# Patient Record
Sex: Male | Born: 1965 | Race: White | Hispanic: No | Marital: Married | Smoking: Never smoker
Health system: Southern US, Community
[De-identification: ages and names within clinical notes are randomized; demographics above are authoritative.]

## PROBLEM LIST (undated history)

## (undated) DIAGNOSIS — M1990 Unspecified osteoarthritis, unspecified site: Secondary | ICD-10-CM

## (undated) DIAGNOSIS — I10 Essential (primary) hypertension: Secondary | ICD-10-CM

## (undated) HISTORY — PX: JOINT REPLACEMENT: SHX530

## (undated) HISTORY — PX: SINOSCOPY: SHX187

## (undated) HISTORY — DX: Essential (primary) hypertension: I10

## (undated) HISTORY — DX: Unspecified osteoarthritis, unspecified site: M19.90

---

## 2002-03-09 ENCOUNTER — Emergency Department (HOSPITAL_COMMUNITY): Admission: EM | Admit: 2002-03-09 | Discharge: 2002-03-09 | Payer: Self-pay | Admitting: Emergency Medicine

## 2006-06-28 ENCOUNTER — Encounter: Admission: RE | Admit: 2006-06-28 | Discharge: 2006-06-28 | Payer: Self-pay | Admitting: Specialist

## 2009-03-01 ENCOUNTER — Encounter: Admission: RE | Admit: 2009-03-01 | Discharge: 2009-03-01 | Payer: Self-pay | Admitting: Family Medicine

## 2011-09-26 ENCOUNTER — Other Ambulatory Visit (HOSPITAL_COMMUNITY): Payer: Self-pay | Admitting: Anesthesiology

## 2011-09-26 DIAGNOSIS — M545 Low back pain: Secondary | ICD-10-CM

## 2011-09-30 ENCOUNTER — Ambulatory Visit (HOSPITAL_COMMUNITY)
Admission: RE | Admit: 2011-09-30 | Discharge: 2011-09-30 | Disposition: A | Discharge status: Home / Self Care | Payer: BC Managed Care – PPO | Source: Ambulatory Visit | Attending: Anesthesiology | Admitting: Anesthesiology

## 2011-09-30 DIAGNOSIS — M48061 Spinal stenosis, lumbar region without neurogenic claudication: Secondary | ICD-10-CM | POA: Insufficient documentation

## 2011-09-30 DIAGNOSIS — M545 Low back pain, unspecified: Secondary | ICD-10-CM | POA: Insufficient documentation

## 2011-09-30 DIAGNOSIS — Q762 Congenital spondylolisthesis: Secondary | ICD-10-CM | POA: Insufficient documentation

## 2011-09-30 DIAGNOSIS — IMO0001 Reserved for inherently not codable concepts without codable children: Secondary | ICD-10-CM | POA: Insufficient documentation

## 2013-04-20 ENCOUNTER — Ambulatory Visit (INDEPENDENT_AMBULATORY_CARE_PROVIDER_SITE_OTHER): Payer: BC Managed Care – PPO | Admitting: Family Medicine

## 2013-04-20 VITALS — BP 120/78 | HR 85 | Temp 97.9°F | Resp 18 | Ht 67.5 in | Wt 168.0 lb

## 2013-04-20 DIAGNOSIS — M722 Plantar fascial fibromatosis: Secondary | ICD-10-CM

## 2013-04-20 DIAGNOSIS — M79609 Pain in unspecified limb: Secondary | ICD-10-CM

## 2013-04-20 MED ORDER — METHYLPREDNISOLONE ACETATE 80 MG/ML IJ SUSP: 80.0000 mg | Freq: Once | INTRAMUSCULAR | Status: AC

## 2013-04-20 MED ORDER — PREDNISONE 20 MG PO TABS: ORAL_TABLET | ORAL | Status: DC

## 2013-04-20 NOTE — Progress Notes (Signed)
47 yo with left heel x 2 months, worsening. No injury recalled.  Tried inserts, Naproxen  Objective:  NAD Tender left heel at site of origin of plantar fascia Left heel was injected after sterile prep with Depo-Medrol 80 mg and 1 cc of Marcaine. He is moderate relief from pain in his gait was stable when he left.  Assessment:  Plantar fasciitis, chronic  Plantar fasciitis of left foot - Plan: methylPREDNISolone acetate (DEPO-MEDROL) injection 80 mg, predniSONE (DELTASONE) 20 MG tablet  Signed, Elvina Sidle, MD

## 2013-04-20 NOTE — Patient Instructions (Signed)

## 2013-10-07 ENCOUNTER — Ambulatory Visit (INDEPENDENT_AMBULATORY_CARE_PROVIDER_SITE_OTHER): Payer: BC Managed Care – PPO | Admitting: Emergency Medicine

## 2013-10-07 VITALS — BP 110/70 | HR 82 | Temp 98.0°F | Resp 16 | Ht 66.5 in | Wt 166.0 lb

## 2013-10-07 DIAGNOSIS — M722 Plantar fascial fibromatosis: Secondary | ICD-10-CM

## 2013-10-07 MED ORDER — NAPROXEN SODIUM 550 MG PO TABS: 550.0000 mg | ORAL_TABLET | Freq: Two times a day (BID) | ORAL | Status: AC

## 2013-10-07 MED ORDER — TRIAMCINOLONE ACETONIDE 40 MG/ML IJ SUSP: 40.0000 mg | Freq: Once | INTRAMUSCULAR | Status: AC

## 2013-10-07 NOTE — Progress Notes (Signed)
[facility] [address] [place] [address] [phone]  Date:  10/07/2013   Name:  Bradley Shea   DOB:  [birth year]   MRN:  [id number]  PCP:  Delorse Lek, MD    Chief Complaint: Foot Pain   History of Present Illness:  Bradley Shea is a 47 y.o. very pleasant male patient who presents with the following:  History of plantar fasciitis.  Injected once previously.  xrayed and found to have a spur.  No history of injury.  Pain worse on arising and with walking.  No improvement with over the counter medications or other home remedies. Denies other complaint or health concern today.   There are no active problems to display for this patient.   Past Medical History  Diagnosis Date  . Arthritis   . Hypertension     Past Surgical History  Procedure Laterality Date  . Joint replacement      History  Substance Use Topics  . Smoking status: Never Smoker   . Smokeless tobacco: Not on file  . Alcohol Use: No    History reviewed. No pertinent family history.  Allergies  Allergen Reactions  . Codeine Anaphylaxis    Medication list has been reviewed and updated.  Current Outpatient Prescriptions on File Prior to Visit  Medication Sig Dispense Refill  . lisinopril (PRINIVIL,ZESTRIL) 40 MG tablet Take 40 mg by mouth daily.      . Naproxen Sodium (ALEVE PO) Take by mouth.       Current Facility-Administered Medications on File Prior to Visit  Medication Dose Route Frequency Provider Last Rate Last Dose  . methylPREDNISolone acetate (DEPO-MEDROL) injection 80 mg  80 mg Intramuscular Once Elvina Sidle, MD        Review of Systems:  As per HPI, otherwise negative.    Physical Examination: Filed Vitals:   10/07/13 1641  BP: 110/70  Pulse: 82  Temp: 98 F (36.7 C)  Resp: 16   Filed Vitals:   10/07/13 1641  Height: 5' 6.5" (1.689 m)  Weight: 166 lb (75.297 kg)   Body mass index is 26.39 kg/(m^2). Ideal Body Weight: Weight in (lb) to  have BMI = 25: 156.9   GEN: WDWN, NAD, Non-toxic, Alert & Oriented x 3 HEENT: Atraumatic, Normocephalic.  Ears and Nose: No external deformity. EXTR: No clubbing/cyanosis/edema NEURO: Normal gait.  PSYCH: Normally interactive. Conversant. Not depressed or anxious appearing.  Calm demeanor.  LEFT foot:  Tender anterior heel.    Assessment and Plan: Plantar fasciitis Kenalog and marcaine  Signed,  Phillips Odor, MD

## 2018-07-09 ENCOUNTER — Other Ambulatory Visit: Payer: Self-pay | Admitting: Otolaryngology

## 2018-07-09 ENCOUNTER — Ambulatory Visit
Admission: RE | Admit: 2018-07-09 | Discharge: 2018-07-09 | Disposition: A | Discharge status: Home / Self Care | Payer: BLUE CROSS/BLUE SHIELD | Source: Ambulatory Visit | Attending: Otolaryngology | Admitting: Otolaryngology

## 2018-07-09 DIAGNOSIS — J320 Chronic maxillary sinusitis: Secondary | ICD-10-CM

## 2018-07-09 DIAGNOSIS — J322 Chronic ethmoidal sinusitis: Secondary | ICD-10-CM

## 2018-07-09 DIAGNOSIS — J321 Chronic frontal sinusitis: Secondary | ICD-10-CM

## 2018-08-15 ENCOUNTER — Encounter: Payer: Self-pay | Admitting: Allergy

## 2018-08-15 ENCOUNTER — Ambulatory Visit (INDEPENDENT_AMBULATORY_CARE_PROVIDER_SITE_OTHER): Payer: BLUE CROSS/BLUE SHIELD | Admitting: Allergy

## 2018-08-15 VITALS — BP 160/90 | HR 84 | Temp 97.9°F | Resp 16 | Ht 66.0 in | Wt 170.0 lb

## 2018-08-15 DIAGNOSIS — J329 Chronic sinusitis, unspecified: Secondary | ICD-10-CM

## 2018-08-15 DIAGNOSIS — J3089 Other allergic rhinitis: Secondary | ICD-10-CM | POA: Diagnosis not present

## 2018-08-15 MED ORDER — FLUTICASONE PROPIONATE 93 MCG/ACT NA EXHU: 2.0000 | INHALANT_SUSPENSION | Freq: Two times a day (BID) | NASAL | 5 refills | Status: DC

## 2018-08-15 MED ORDER — FLUTICASONE PROPIONATE 93 MCG/ACT NA EXHU: 2.0000 | INHALANT_SUSPENSION | Freq: Two times a day (BID) | NASAL | 5 refills | Status: AC

## 2018-08-15 NOTE — Assessment & Plan Note (Signed)
Frequent sinus infections which may be triggered by underlying environmental allergies. Will make additional recommendations based on skin testing results next week.  Keep track of infections.

## 2018-08-15 NOTE — Progress Notes (Signed)
New Patient Note  RE: Bradley Shea MRN: [id number] DOB: September 25, 1966 Date of Office Visit: 08/15/2018  Referring provider: Keturah Barre, MD Primary care provider: Roger Kill, PA-C  Chief Complaint: New Patient (Initial Visit) (sinus problems )  History of Present Illness: I had the pleasure of seeing Bradley Shea for initial evaluation at the [facility] on 08/15/2018. He is a 52 y.o. male, who is referred here by Dr. Haroldine Laws for the evaluation of sinus issues.   Rhinitis: He reports symptoms of frequent sinus infections, sneezing, rhinorrhea, nasal congestion, coughing, itchy/watery eyes. Symptoms have been going on for 10+ years. The symptoms are present all year around with worsening in fall and spring. Other triggers include exposure to unknown. Anosmia: no. Headache: yes. He has used Flonase 1 spray QD x few years, allegra with some improvement in symptoms. Sinus infections: yes about 3-4 per year. Previous work up includes: none. Previous ENT evaluation: yes.  Patient had sinus surgery in 1997 which helped for about 10 years. Previous sinus imaging: yes. 07/09/2018: IMPRESSION: 1. Possible right inferior frontal sinus mucosal thickening. 2. No detected maxillary sinusitis. 3. Deviated septum to the right.  Assessment and Plan: Bradley Shea is a 52 y.o. male with: Other allergic rhinitis Perennial rhinitis symptoms for the past 10+ years with worsening in the fall and spring. Had sinus surgery over 10 years ago. Follows with ENT. Currently on allegra and Flonase with some benefit. Has issues with frequent sinus infections. Unable to skin test today due to recent antihistamine intake. Will test next week - no antihistamines for at least 3 days.  Stop Flonase.  Hold antihistamines such as allegra until skin testing next week.  Start Xhance 2 sprays twice a day and demonstrated proper use.   Frequent sinus infections Frequent sinus infections which may be  triggered by underlying environmental allergies. Will make additional recommendations based on skin testing results next week.  Keep track of infections.  Return in about 4 days (around 08/19/2018) for Skin testing.  Meds ordered this encounter  Medications  . DISCONTD: Fluticasone Propionate (XHANCE) 93 MCG/ACT EXHU    Sig: Place 2 sprays into the nose 2 (two) times daily.    Dispense:  32 mL    Refill:  5  . Fluticasone Propionate (XHANCE) 93 MCG/ACT EXHU    Sig: Place 2 sprays into the nose 2 (two) times daily.    Dispense:  32 mL    Refill:  5   Other allergy screening: Asthma: no Rhino conjunctivitis: yes Food allergy: no Medication allergy: yes  Codeine - GI issues Hymenoptera allergy: no Urticaria: no Eczema:no History of recurrent infections suggestive of immunodeficency: no  Diagnostics: Skin Testing: Deferred due to recent antihistamines use.  Past Medical History: Patient Active Problem List   Diagnosis Date Noted  . Other allergic rhinitis 08/15/2018  . Frequent sinus infections 08/15/2018   Past Medical History:  Diagnosis Date  . Arthritis   . Hypertension    Past Surgical History: Past Surgical History:  Procedure Laterality Date  . JOINT REPLACEMENT    . SINOSCOPY     Medication List:  Current Outpatient Medications  Medication Sig Dispense Refill  . fexofenadine (ALLEGRA) 180 MG tablet Take 180 mg by mouth daily.  3  . lisinopril (PRINIVIL,ZESTRIL) 40 MG tablet Take 40 mg by mouth daily.    . Naproxen Sodium (ALEVE PO) Take by mouth.    . Fluticasone Propionate (XHANCE) 93 MCG/ACT EXHU Place  2 sprays into the nose 2 (two) times daily. 32 mL 5   Current Facility-Administered Medications  Medication Dose Route Frequency Provider Last Rate Last Dose  . methylPREDNISolone acetate (DEPO-MEDROL) injection 80 mg  80 mg Intramuscular Once Elvina Sidle, MD      . triamcinolone acetonide (KENALOG-40) injection 40 mg  40 mg Intramuscular Once  Carmelina Dane, MD       Allergies: Allergies  Allergen Reactions  . Codeine Anaphylaxis   Social History: Social History   Socioeconomic History  . Marital status: Married    Spouse name: Not on file  . Number of children: Not on file  . Years of education: Not on file  . Highest education level: Not on file  Occupational History  . Not on file  Social Needs  . Financial resource strain: Not on file  . Food insecurity:    Worry: Not on file    Inability: Not on file  . Transportation needs:    Medical: Not on file    Non-medical: Not on file  Tobacco Use  . Smoking status: Never Smoker  . Smokeless tobacco: Never Used  Substance and Sexual Activity  . Alcohol use: No  . Drug use: No  . Sexual activity: Not on file  Lifestyle  . Physical activity:    Days per week: Not on file    Minutes per session: Not on file  . Stress: Not on file  Relationships  . Social connections:    Talks on phone: Not on file    Gets together: Not on file    Attends religious service: Not on file    Active member of club or organization: Not on file    Attends meetings of clubs or organizations: Not on file    Relationship status: Not on file  Other Topics Concern  . Not on file  Social History Narrative  . Not on file   Lives in a 53 year old home. Smoking: denies  Occupation: Adult nurse HistorySurveyor, minerals in the house: no Carpet in the family room: yes Carpet in the bedroom: yes Heating: electric Cooling: central Pet: no  Family History: History reviewed. No pertinent family history. Problem                               Relation Asthma                                   Mother  Eczema                                No  Food allergy                          No  Allergic rhino conjunctivitis     Mother   Review of Systems  Constitutional: Negative for appetite change, chills, fever and unexpected weight change.  HENT: Positive for  congestion, rhinorrhea and sinus pressure.   Eyes: Negative for itching.  Respiratory: Negative for cough, chest tightness, shortness of breath and wheezing.   Cardiovascular: Negative for chest pain.  Gastrointestinal: Negative for abdominal pain.  Genitourinary: Negative for difficulty urinating.  Skin: Negative for rash.  Allergic/Immunologic: Positive for environmental allergies. Negative for food allergies.  Neurological: Positive for headaches.   Objective: BP (!) 160/90 (BP Location: Right Arm, Patient Position: Sitting, Cuff Size: Normal)   Pulse 84   Temp 97.9 F (36.6 C) (Oral)   Resp 16   Ht 5\' 6"  (1.676 m)   Wt 170 lb (77.1 kg)   SpO2 97%   BMI 27.44 kg/m  Body mass index is 27.44 kg/m. Physical Exam  Constitutional: He is oriented to person, place, and time. He appears well-developed and well-nourished.  HENT:  Head: Normocephalic and atraumatic.  Right Ear: External ear normal.  Left Ear: External ear normal.  Nose: Nose normal.  Mouth/Throat: Oropharynx is clear and moist.  Eyes: Conjunctivae and EOM are normal.  Neck: Neck supple.  Cardiovascular: Normal rate, regular rhythm and normal heart sounds. Exam reveals no gallop and no friction rub.  No murmur heard. Pulmonary/Chest: Effort normal and breath sounds normal. He has no wheezes. He has no rales.  Lymphadenopathy:    He has no cervical adenopathy.  Neurological: He is alert and oriented to person, place, and time.  Skin: Skin is warm. No rash noted.  Psychiatric: He has a normal mood and affect. His behavior is normal.  Nursing note and vitals reviewed.  The plan was reviewed with the patient/family, and all questions/concerned were addressed.  It was my pleasure to see Bradley Shea today and participate in his care. Please feel free to contact me with any questions or concerns.  Sincerely,  Wyline Mood, DO Allergy & Immunology  [facility] office:  [phone] [facility] office:[phone]

## 2018-08-15 NOTE — Assessment & Plan Note (Addendum)
Perennial rhinitis symptoms for the past 10+ years with worsening in the fall and spring. Had sinus surgery over 10 years ago. Follows with ENT. Currently on allegra and Flonase with some benefit. Has issues with frequent sinus infections. Unable to skin test today due to recent antihistamine intake. Will test next week - no antihistamines for at least 3 days.  Stop Flonase.  Hold antihistamines such as allegra until skin testing next week.  Start Xhance 2 sprays twice a day and demonstrated proper use.

## 2018-08-15 NOTE — Patient Instructions (Addendum)
Other allergic rhinitis Perennial rhinitis symptoms for the past 10+ years with worsening in the fall and spring. Had sinus surgery over 10 years ago. Follows with ENT. Currently on allegra and Flonase with some benefit. Has issues with frequent sinus infections. Unable to skin test today due to recent antihistamine intake. Will test next week - no antihistamines for at least 3 days.  Stop Flonase.  Hold antihistamines such as allegra until skin testing next week.  Start Xhance 2 sprays twice a day and demonstrated proper use.   Frequent sinus infections Frequent sinus infections which may be triggered by underlying environmental allergies. Will make additional recommendations based on skin testing results next week.  Keep track of infections.  Return in about 4 days (around 08/19/2018) for Skin testing.

## 2018-08-19 ENCOUNTER — Encounter: Payer: Self-pay | Admitting: Allergy and Immunology

## 2018-08-19 ENCOUNTER — Ambulatory Visit (INDEPENDENT_AMBULATORY_CARE_PROVIDER_SITE_OTHER): Payer: BLUE CROSS/BLUE SHIELD | Admitting: Allergy

## 2018-08-19 VITALS — BP 120/78 | HR 84 | Resp 16

## 2018-08-19 DIAGNOSIS — J3089 Other allergic rhinitis: Secondary | ICD-10-CM | POA: Diagnosis not present

## 2018-08-19 DIAGNOSIS — J329 Chronic sinusitis, unspecified: Secondary | ICD-10-CM | POA: Diagnosis not present

## 2018-08-19 NOTE — Progress Notes (Signed)
Follow Up Note  RE: Bradley Shea MRN: [id number] DOB: [birth year] Date of Office Visit: 08/19/2018  Referring provider: Roger Kill, * Primary care provider: Roger Kill, PA-C  Chief Complaint: Allergy Testing  History of Present Illness: I had the pleasure of seeing Bradley Shea for a follow up visit at the [facility] on 08/19/2018. He is a 52 y.o. male, who is being followed for chronic rhinitis and frequent sinus infections. Today he is here for skin testing. His previous allergy office visit was on 08/15/2018 with Dr. Selena Batten.   Stopped Allegra and feeling some increased nasal congestion.  Started Xhance 2 sprays twice a day and thinks it is working better than the previous nasal sprays.  Assessment and Plan: Bradley Shea is a 52 y.o. male with: Other allergic rhinitis Perennial rhinitis symptoms for the past 10+ years with worsening in the fall and spring. Had sinus surgery over 10 years ago. Follows with ENT. Currently on allegra and Flonase with some benefit. Has issues with frequent sinus infections.   Today's skin testing showed: minimally positive to grass and mold.  Discussed environmental control measures.  Use nettipot daily as needed.   Continue Xhance 2 sprays twice a day.  Frequent sinus infections Minimally positive allergy testing to grass and mold.   Keep track of infections. If still frequent then will get bloodwork to look at basic immune system.  Return in about 4 months (around 12/18/2018).  No orders of the defined types were placed in this encounter.  Diagnostics: Skin Testing: Environmental allergy panel. Positive test to: borderline to grass and mold.  Results discussed with patient/family. Airborne Adult Perc - 08/19/18 0924    Time Antigen Placed  7829    Allergen Manufacturer  Waynette Buttery    Location  Back    Number of Test  59    Panel 1  Select    1. Control-Buffer 50% Glycerol  Negative    2. Control-Histamine 1 mg/ml   4+    3. Albumin saline  Negative    4. Bahia  Negative    5. French Southern Territories  Negative    6. Johnson  Negative    7. [place] Blue  Negative    8. Meadow Fescue  Negative    9. Perennial Rye  Negative    10. Sweet Vernal  2+    11. Timothy  Negative    12. Cocklebur  Negative    13. Burweed Marshelder  Negative    14. Ragweed, short  Negative    15. Ragweed, Giant  Negative    16. Plantain,  English  Negative    17. Lamb's Quarters  Negative    18. Sheep Sorrell  Negative    19. Rough Pigweed  Negative    20. Marsh Elder, Rough  Negative    21. Mugwort, Common  Negative    22. Ash mix  Negative    23. Birch mix  Negative    24. Beech American  Negative    25. Box, Elder  Negative    26. Cedar, red  Negative    27. Cottonwood, Guinea-Bissau  Negative    28. Elm mix  Negative    29. Hickory mix  Negative    30. Maple mix  Negative    31. Oak, Guinea-Bissau mix  Negative    32. Pecan Pollen  Negative    33. Pine mix  Negative    34. Sycamore Eastern  Negative  35. Walnut, Black Pollen  Negative    36. Alternaria alternata  Negative    37. Cladosporium Herbarum  Negative    38. Aspergillus mix  Negative    39. Penicillium mix  Negative    40. Bipolaris sorokiniana (Helminthosporium)  Negative    41. Drechslera spicifera (Curvularia)  Negative    42. Mucor plumbeus  Negative    43. Fusarium moniliforme  Negative    44. Aureobasidium pullulans (pullulara)  Negative    45. Rhizopus oryzae  Negative    46. Botrytis cinera  Negative    47. Epicoccum nigrum  Negative    48. Phoma betae  Negative    49. Candida Albicans  Negative    50. Trichophyton mentagrophytes  Negative    51. Mite, D Farinae  5,000 AU/ml  Negative    52. Mite, D Pteronyssinus  5,000 AU/ml  Negative    53. Cat Hair 10,000 BAU/ml  Negative    54.  Dog Epithelia  Negative    55. Mixed Feathers  Negative    56. Horse Epithelia  Negative    57. Cockroach, German  Negative    58. Mouse  Negative    59. Tobacco Leaf   Negative     Intradermal - 08/19/18 0944    Time Antigen Placed  4098    Allergen Manufacturer  Waynette Buttery    Location  Arm    Number of Test  15    Intradermal  Select    Control  Negative    [place] Territ[place]  Negative    Johnson  Negative    7 Grass  Negative    Ragweed mix  Negative    Weed mix  Negative    Tree mix  Negative    Mold 1  Negative    Mold 2  Negative    Mold 3  2+    Mold 4  Negative    Cat  Negative    Dog  Negative    Cockroach  Negative    Mite mix  Negative       Medication List:  Current Outpatient Medications  Medication Sig Dispense Refill  . fexofenadine (ALLEGRA) 180 MG tablet Take 180 mg by mouth daily.  3  . Fluticasone Propionate (XHANCE) 93 MCG/ACT EXHU Place 2 sprays into the nose 2 (two) times daily. 32 mL 5  . lisinopril (PRINIVIL,ZESTRIL) 40 MG tablet Take 40 mg by mouth daily.    . Naproxen Sodium (ALEVE PO) Take by mouth.     Current Facility-Administered Medications  Medication Dose Route Frequency Provider Last Rate Last Dose  . methylPREDNISolone acetate (DEPO-MEDROL) injection 80 mg  80 mg Intramuscular Once Elvina Sidle, MD      . triamcinolone acetonide (KENALOG-40) injection 40 mg  40 mg Intramuscular Once Carmelina Dane, MD       Allergies: Allergies  Allergen Reactions  . Codeine Anaphylaxis   I reviewed his past medical history, social history, family history, and environmental history and no significant changes have been reported from previous visit on 08/15/2018.  Review of Systems  Constitutional: Negative for appetite change, chills, fever and unexpected weight change.  HENT: Positive for congestion, rhinorrhea and sinus pressure.   Eyes: Negative for itching.  Respiratory: Negative for cough, chest tightness, shortness of breath and wheezing.   Gastrointestinal: Negative for abdominal pain.  Genitourinary: Negative for difficulty urinating.  Skin: Negative for rash.  Allergic/Immunologic: Positive for environmental  allergies. Negative for food allergies.  Neurological: Positive for headaches.   Objective: BP 120/78 (BP Location: Left Arm, Patient Position: Sitting, Cuff Size: Normal)   Pulse 84   Resp 16  There is no height or weight on file to calculate BMI. Physical Exam  Constitutional: He is oriented to person, place, and time. He appears well-developed and well-nourished.  HENT:  Head: Normocephalic and atraumatic.  Right Ear: External ear normal.  Left Ear: External ear normal.  Nose: Nose normal.  Mouth/Throat: Oropharynx is clear and moist.  Eyes: Conjunctivae and EOM are normal.  Neck: Neck supple.  Cardiovascular: Normal rate, regular rhythm and normal heart sounds. Exam reveals no gallop and no friction rub.  No murmur heard. Pulmonary/Chest: Effort normal and breath sounds normal. He has no wheezes. He has no rales.  Neurological: He is alert and oriented to person, place, and time.  Skin: Skin is warm. No rash noted.  Psychiatric: He has a normal mood and affect. His behavior is normal.  Nursing note and vitals reviewed.  Previous notes and tests were reviewed. The plan was reviewed with the patient/family, and all questions/concerned were addressed.  It was my pleasure to see Bradley Shea today and participate in his care. Please feel free to contact me with any questions or concerns.  Sincerely,  Wyline Mood, DO Allergy & Immunology  [facility] office: [phone] [facility] office:[phone]

## 2018-08-19 NOTE — Patient Instructions (Addendum)
Other allergic rhinitis Perennial rhinitis symptoms for the past 10+ years with worsening in the fall and spring. Had sinus surgery over 10 years ago. Follows with ENT. Currently on allegra and Flonase with some benefit. Has issues with frequent sinus infections.   Today's skin testing showed: minimally positive to grass and mold.  Discussed environmental control measures.  Use nettipot daily as needed.   Continue Xhance 2 sprays twice a day.  Frequent sinus infections Minimally positive allergy testing to grass and mold.   Keep track of infections. If still frequent then will get bloodwork to look at basic immune system.  Return in about 4 months (around 12/18/2018).  Reducing Pollen Exposure . Pollen seasons: trees (spring), grass (summer) and ragweed/weeds (fall). Marland Kitchen Keep windows closed in your home and car to lower pollen exposure.  Lilian Kapur air conditioning in the bedroom and throughout the house if possible.  . Avoid going out in dry windy days - especially early morning. . Pollen counts are highest between 5 - 10 AM and on dry, hot and windy days.  . Save outside activities for late afternoon or after a heavy rain, when pollen levels are lower.  . Avoid mowing of grass if you have grass pollen allergy. Marland Kitchen Be aware that pollen can also be transported indoors on people and pets.  . Dry your clothes in an automatic dryer rather than hanging them outside where they might collect pollen.  . Rinse hair and eyes before bedtime. Mold Control . Mold and fungi can grow on a variety of surfaces provided certain temperature and moisture conditions exist.  . Outdoor molds grow on plants, decaying vegetation and soil. The major outdoor mold, Alternaria and Cladosporium, are found in very high numbers during hot and dry conditions. Generally, a late summer - fall peak is seen for common outdoor fungal spores. Rain will temporarily lower outdoor mold spore count, but counts rise rapidly when the  rainy period ends. . The most important indoor molds are Aspergillus and Penicillium. Dark, humid and poorly ventilated basements are ideal sites for mold growth. The next most common sites of mold growth are the bathroom and the kitchen. Outdoor (Seasonal) Mold Control . Use air conditioning and keep windows closed. . Avoid exposure to decaying vegetation. Marland Kitchen Avoid leaf raking. . Avoid grain handling. . Consider wearing a face mask if working in moldy areas.  Indoor (Perennial) Mold Control  . Maintain humidity below 50%. . Get rid of mold growth on hard surfaces with water, detergent and, if necessary, 5% bleach (do not mix with other cleaners). Then dry the area completely. If mold covers an area more than 10 square feet, consider hiring an indoor environmental professional. . For clothing, washing with soap and water is best. If moldy items cannot be cleaned and dried, throw them away. . Remove sources e.g. contaminated carpets. . Repair and seal leaking roofs or pipes. Using dehumidifiers in damp basements may be helpful, but empty the water and clean units regularly to prevent mildew from forming. All rooms, especially basements, bathrooms and kitchens, require ventilation and cleaning to deter mold and mildew growth. Avoid carpeting on concrete or damp floors, and storing items in damp areas. Buffered Isotonic Saline Irrigations:  Goal: . When you irrigate with the isotonic saline (salt water) it washes mucous and other debris from your nose that could be contributing to your nasal symptoms.   Recipe: Marland Kitchen Obtain 1 quart jar that is clean . Fill with clean (bottled, boiled  or distilled) water . Add 1-2 heaping teaspoons of salt without iodine o If the solution with 2 teaspoons of salt is too strong, adjust the amount down until better tolerated . Add 1 teaspoon of [employer] baking soda (pure bicarbonate) . Mix ingredients together and store at room temperature and discard after 1  week * Alternatively you can buy pre made salt packets for the [employer] bottle or there          are other over the counter brands available  Instructions: . Warm  cup of the solution in the microwave if desired but be careful not to overheat as this will burn the inside of your nose . Stand over a sink (or do it while you shower) and squirt the solution into one side of your nose aiming towards the back of your head o Sometimes saying "k k k k k" while irrigating can be helpful to prevent fluid from going down your throat  . The solution will travel to the back of your nose and then come out the other side . Perform this again on the other side . Try to do this twice a day . If you are using a nasal spray in addition to the irrigation, irrigate first and then use the topical nasal spray otherwise you will wash the nasal spray out of your nose

## 2018-08-19 NOTE — Assessment & Plan Note (Addendum)
Minimally positive allergy testing to grass and mold.   Keep track of infections. If still frequent then will get bloodwork to look at basic immune system.

## 2018-08-19 NOTE — Assessment & Plan Note (Addendum)
Perennial rhinitis symptoms for the past 10+ years with worsening in the fall and spring. Had sinus surgery over 10 years ago. Follows with ENT. Currently on allegra and Flonase with some benefit. Has issues with frequent sinus infections.   Today's skin testing showed: minimally positive to grass and mold.  Discussed environmental control measures.  Use nettipot daily as needed.   Continue Xhance 2 sprays twice a day.

## 2019-05-06 IMAGING — CR DG SINUSES 1-2V
2 series · 2 of 2 positions shown · non-contrast
Comparison: None.

CLINICAL DATA: Chronic sinus pain.  Maxillary sinusitis.

EXAM:
PARANASAL SINUSES - 1-2 VIEW

[w waters]
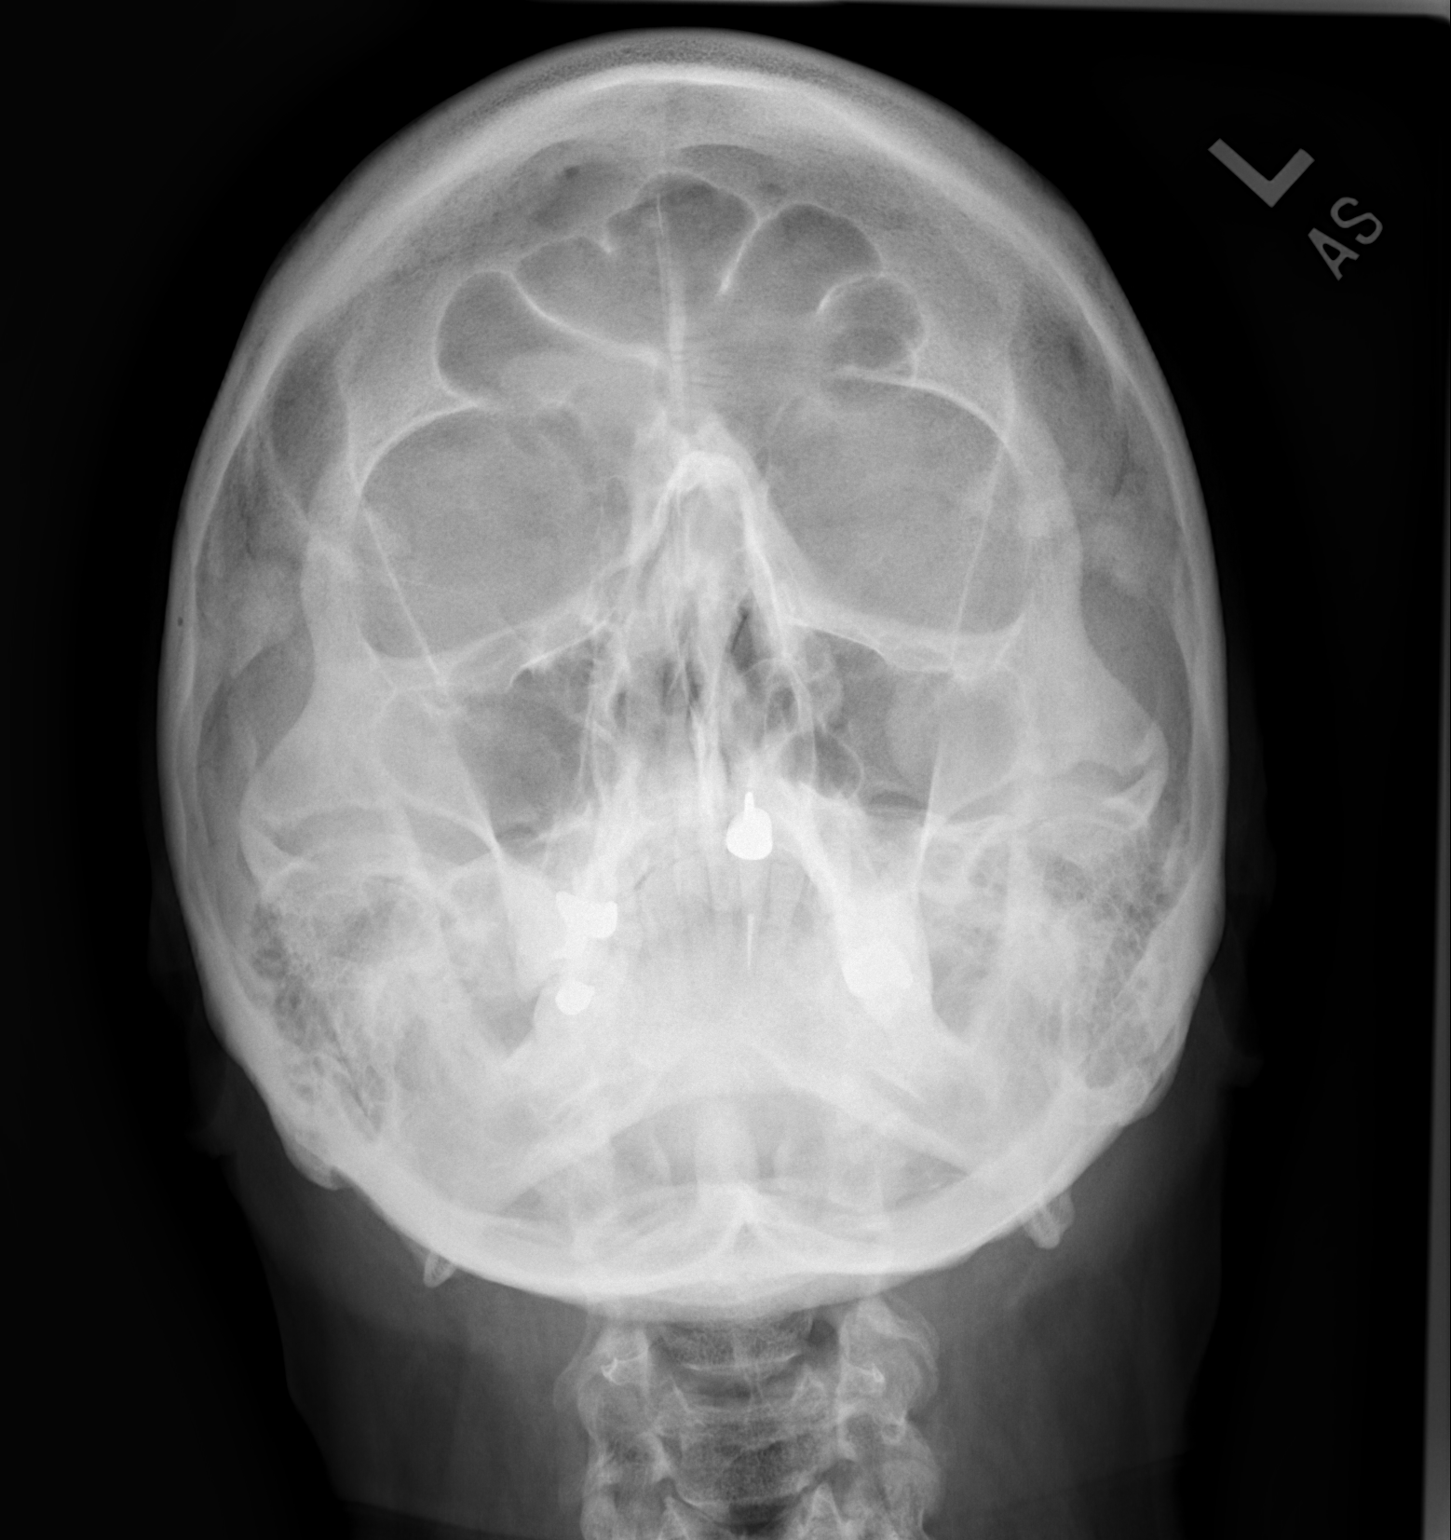

[w skull lat]
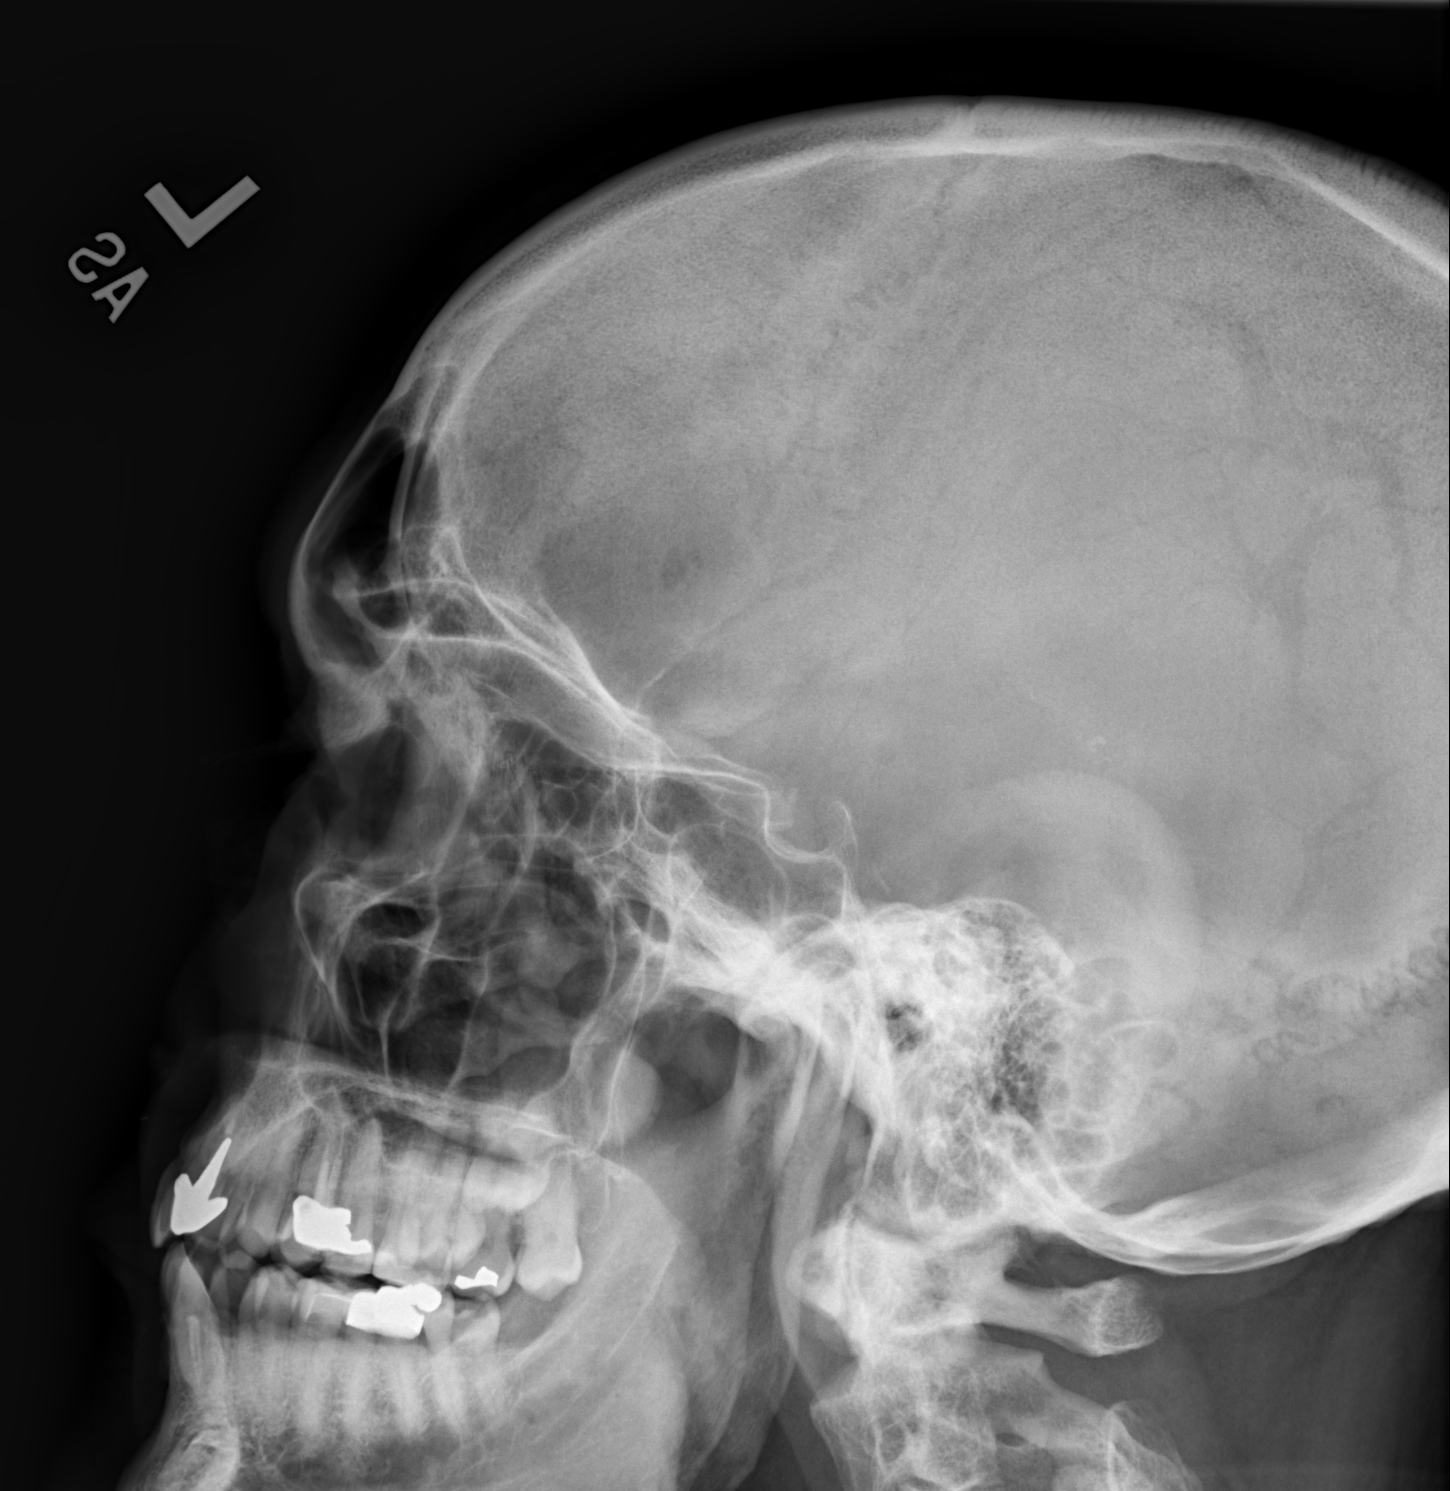

[2 of 2 positions shown; findings below may reference images not displayed]

FINDINGS: Symmetric clear maxillary sinus appearance. There may be
opacification of the inferior right frontal sinus where there is
asymmetric somewhat lobulated soft tissue density. In the lateral
projection the sphenoid sinuses are clear.

The nasal septum is deviated to the right. No evidence of
destructive process.
IMPRESSION: 1. Possible right inferior frontal sinus mucosal thickening.
2. No detected maxillary sinusitis.
3. Deviated septum to the right.

## 2023-05-28 ENCOUNTER — Telehealth: Payer: Self-pay | Admitting: Internal Medicine

## 2023-05-28 NOTE — Telephone Encounter (Signed)
Inbound call from patient wishing to schedule with Dr. Rhea Belton for blood in stool. Patient last had a colonoscopy at [facility] in 2022. Patient is wishing to transfer his care due to Dr. Rhea Belton being highly recommended by his close friends. Patient will have records sent over for review.
